# Patient Record
Sex: Female | Born: 1985 | Race: Black or African American | Hispanic: No | Marital: Married | Smoking: Never smoker
Health system: Southern US, Community
[De-identification: ages and names within clinical notes are randomized; demographics above are authoritative.]

---

## 2018-06-06 ENCOUNTER — Encounter (HOSPITAL_COMMUNITY): Payer: Self-pay | Admitting: Emergency Medicine

## 2018-06-06 ENCOUNTER — Other Ambulatory Visit: Payer: Self-pay

## 2018-06-06 ENCOUNTER — Emergency Department (HOSPITAL_COMMUNITY): Payer: Self-pay

## 2018-06-06 ENCOUNTER — Emergency Department (HOSPITAL_COMMUNITY)
Admission: EM | Admit: 2018-06-06 | Discharge: 2018-06-06 | Disposition: A | Discharge status: Home / Self Care | Payer: Self-pay | Attending: Emergency Medicine | Admitting: Emergency Medicine

## 2018-06-06 DIAGNOSIS — R946 Abnormal results of thyroid function studies: Secondary | ICD-10-CM | POA: Insufficient documentation

## 2018-06-06 DIAGNOSIS — R7989 Other specified abnormal findings of blood chemistry: Secondary | ICD-10-CM

## 2018-06-06 DIAGNOSIS — R1084 Generalized abdominal pain: Secondary | ICD-10-CM | POA: Insufficient documentation

## 2018-06-06 LAB — CBC
HCT: 38.5 % (ref 36.0–46.0)
HEMOGLOBIN: 12.1 g/dL (ref 12.0–15.0)
MCH: 27.9 pg (ref 26.0–34.0)
MCHC: 31.4 g/dL (ref 30.0–36.0)
MCV: 88.9 fL (ref 78.0–100.0)
Platelets: 290 10*3/uL (ref 150–400)
RBC: 4.33 MIL/uL (ref 3.87–5.11)
RDW: 12.4 % (ref 11.5–15.5)
WBC: 5.3 10*3/uL (ref 4.0–10.5)

## 2018-06-06 LAB — BASIC METABOLIC PANEL
ANION GAP: 7 (ref 5–15)
BUN: 6 mg/dL (ref 6–20)
CALCIUM: 8.8 mg/dL — AB (ref 8.9–10.3)
CO2: 21 mmol/L — ABNORMAL LOW (ref 22–32)
Chloride: 107 mmol/L (ref 98–111)
Creatinine, Ser: 0.54 mg/dL (ref 0.44–1.00)
GFR calc non Af Amer: >60 mL/min (ref >60)
GLUCOSE: 202 mg/dL — AB (ref 70–99)
Potassium: 4 mmol/L (ref 3.5–5.1)
SODIUM: 135 mmol/L (ref 135–145)

## 2018-06-06 LAB — I-STAT TROPONIN, ED: TROPONIN I, POC: 0 ng/mL (ref 0.00–0.08)

## 2018-06-06 LAB — I-STAT BETA HCG BLOOD, ED (MC, WL, AP ONLY): I-stat hCG, quantitative: <5 m[IU]/mL (ref <5)

## 2018-06-06 LAB — TSH

## 2018-06-06 MED ORDER — GI COCKTAIL ~~LOC~~
30.0000 mL | Freq: Once | ORAL | Status: AC
  Administered 2018-06-06: 30 mL via ORAL
  Filled 2018-06-06: qty 30

## 2018-06-06 MED ORDER — FAMOTIDINE 20 MG PO TABS
40.0000 mg | ORAL_TABLET | Freq: Once | ORAL | Status: AC
Start: 2018-06-06 — End: 2018-06-06
  Administered 2018-06-06: 40 mg via ORAL
  Filled 2018-06-06: qty 2

## 2018-06-06 NOTE — ED Triage Notes (Signed)
Translator/pt stated, her symptoms are digestion, heart burn, SOB when she walks. This all started 2 months. Ive been taken medicine and not helping.

## 2018-06-06 NOTE — Discharge Instructions (Addendum)
Merci de m'avoir permis de prodigodre vos soins aujourd'hui  l'urgence.  Maalox ou Pepto-bismal sont disponibles en vente libre et aident  rsoudre les problmes digestifs et les [employer]. Vous n'avez pas besoin d'une ordonnance pour [employer] mdicaments.   Vous pouvez [employer] forward[employer]lcharger une application sur votre appel tlphonique MyChart pour afficher vos laboratoires  partir d'aujourd'hui. Ils seront disponibles pour vous de voir dans les prochains jours.  J'ai inclus le nom de l'endocrinologie pratiques numres ci-dessus.  Vous pouvez appeler leurs bureaux pour voir si vous pouvez obtenir un rendez-vous de suivi dans les prochaines semaines.  Retournez au service des urgences si vous dveloppez une agitation grave, des Manufacturing systems engineermouvements anormaux du corps, des [employer] Kiewit Sonschangements dans votre vision, une frquence cardiaque trs rapide, ou d'autres nouveaux symptmes.  Thank you for allowing me to provide your care today in the emergency department.  Maalox or Pepto-bismal are available over the counter and help with digestive problems and upset stomach. You do not need a prescription for these medications.   You can download an App to your phone call MyChart to view your labs from today. They will be available for you to view in the next few days.  I have included the name of to endocrinology practices listed above.  You can call their offices to see if you can get in for a follow-up appointment in the next few weeks.  Return to the Emergency Department if you develop severe agitation, abnormal body movements, changes in your vision, a very fast heart rate, or other new, concerning symptoms.

## 2018-06-06 NOTE — ED Provider Notes (Signed)
Patient placed in Quick Look pathway, seen and evaluated   Chief Complaint: Heart racing,  Pain left chest  HPI:   Pt reports she has hyperthyroid.  Pt takes propranolol   ROS: heart racing  Physical Exam:   Gen: No distress  Neuro: Awake and Alert  Skin: Warm    Focused Exam: Heart tachycardia    Initiation of care has begun. The patient has been counseled on the process, plan, and necessity for staying for the completion/evaluation, and the remainder of the medical screening examination   Osie CheeksSofia, Chrisha Vogel K, PA-C 06/06/18 1353    Shaune PollackIsaacs, Cameron, MD 06/06/18 2024

## 2018-06-06 NOTE — ED Provider Notes (Signed)
[facility] EMERGENCY DEPARTMENT Provider Note   CSN: [id number] Arrival date & time: 06/06/18  1327     History   Chief Complaint Chief Complaint  Patient presents with  . Gastroesophageal Reflux  . Chest Pain    HPI Jessica Bray is a 32 y.o. female with a history of thyroid disease who presents to the emergency department with a chief complaint of "digestion problems".   The patient reports that she was seen and started on propanolol and levothyroxine several weeks ago after she was found to have tachycardia.  Her levothyroxine has since been increased from 40 to 60 mg.  She reports that she was having some "poking" nonradiating, substernal chest pain that would last for approximately 5 seconds, but reports that this pain in the recurrence has significantly improved since she was started on medication.  She has a follow-up appointment with endocrinology in 2 weeks when they return to [place].  The patient's husband states that he is concerned that she may be having other symptoms associated with her thyroid.  She has been endorsing an upset stomach and digestive problems as well as constipation.  Last bowel movement was this morning.    She denies fever, chills, dyspnea, vomiting, melena, hematochezia, back pain, dysuria, hematuria, or rash.   She last took acetaminophen 2 days ago when she was having diffuse muscle pain with resolution of her symptoms.  She states that she has been having muscle pain that seems to be worse at night, but she currently denies any active pain.  The history is provided by the patient. A language interpreter was used [place]).    History reviewed. No pertinent past medical history.  There are no active problems to display for this patient.   History reviewed. No pertinent surgical history.   OB History   None      Home Medications    Prior to Admission medications   Not on File    Family History No family history  on file.  Social History Social History   Tobacco Use  . Smoking status: Never Smoker  . Smokeless tobacco: Never Used  Substance Use Topics  . Alcohol use: Not Currently  . Drug use: Not Currently     Allergies   Patient has no known allergies.   Review of Systems Review of Systems  Constitutional: Negative for activity change, chills and fever.  HENT: Negative for congestion.   Eyes: Negative for visual disturbance.  Respiratory: Negative for cough, choking and shortness of breath.   Cardiovascular: Negative for chest pain, palpitations and leg swelling.  Gastrointestinal: Positive for abdominal pain and nausea. Negative for blood in stool, diarrhea and vomiting.  Genitourinary: Negative for dysuria, hematuria and urgency.  Musculoskeletal: Negative for back pain.  Skin: Negative for rash.  Allergic/Immunologic: Negative for immunocompromised state.  Neurological: Negative for dizziness, seizures, syncope, weakness, numbness and headaches.  Psychiatric/Behavioral: Negative for confusion.   Physical Exam Updated Vital Signs BP 114/75 (BP Location: Right Arm)   Pulse 87   Temp 98.5 F (36.9 C) (Oral)   Resp 16   Wt 40.8 kg (90 lb)   LMP 05/10/2018   SpO2 98%   Physical Exam  Constitutional: No distress.  HENT:  Head: Normocephalic.  Eyes: Conjunctivae are normal.  Neck: Normal range of motion. Neck supple. No tracheal deviation present. No thyromegaly present.  Cardiovascular: Normal rate, regular rhythm, normal heart sounds and intact distal pulses. Exam reveals no gallop and no friction rub.  No murmur heard. Pulmonary/Chest: Effort normal. No stridor. No respiratory distress. She has no wheezes. She has no rales. She exhibits no tenderness.  Abdominal: Soft. She exhibits no distension and no mass. There is no tenderness. There is no rebound and no guarding. No hernia.  Hyperactive bowel sounds.  Abdomen is soft, nontender, nondistended.  She has some right CVA  tenderness, left is nontender.  No guarding or rebound.  No peritoneal signs.  Negative Murphy sign.  No tenderness over McBurney's point.  Musculoskeletal: Normal range of motion. She exhibits no edema, tenderness or deformity.  Lymphadenopathy:    She has no cervical adenopathy.  Neurological: She is alert.  Skin: Skin is warm. Capillary refill takes less than 2 seconds. No rash noted.  Psychiatric: Her behavior is normal.  Nursing note and vitals reviewed.  ED Treatments / Results  Labs (all labs ordered are listed, but only abnormal results are displayed) Labs Reviewed  BASIC METABOLIC PANEL - Abnormal; Notable for the following components:      Result Value   CO2 21 (*)    Glucose, Bld 202 (*)    Calcium 8.8 (*)    All other components within normal limits  TSH - Abnormal; Notable for the following components:   TSH <0.010 (*)    All other components within normal limits  CBC  I-STAT TROPONIN, ED  I-STAT BETA HCG BLOOD, ED ([facility], [facility], [facility] ONLY)    EKG EKG Interpretation  Date/Time:  Monday June 06 2018 13:46:12 EDT Ventricular Rate:  108 PR Interval:  196 QRS Duration: 74 QT Interval:  334 QTC Calculation: 447 R Axis:   51 Text Interpretation:  Sinus tachycardia Nonspecific ST abnormality Abnormal ECG Confirmed by Kristine RoyalMessick, Peter [phone]([id number]) on 06/06/2018 6:21:41 PM   Radiology Dg Chest 2 View  Result Date: 06/06/2018 CLINICAL DATA:  Two-month history of shortness of breath while walking and indigestion. EXAM: CHEST - 2 VIEW COMPARISON:  None. FINDINGS: Suboptimal inspiration accounts for crowded bronchovascular markings, especially in the bases, and accentuates the cardiac silhouette. Taking this into account, cardiomediastinal silhouette unremarkable. Lungs clear. Bronchovascular markings normal. Pulmonary vascularity normal. No visible pleural effusions. No pneumothorax. Visualized bony thorax intact. IMPRESSION: Suboptimal inspiration.  No acute cardiopulmonary disease.  Electronically Signed   By: Hulan Saashomas  Lawrence M.D.   On: 06/06/2018 14:26    Procedures Procedures (including critical care time)  Medications Ordered in ED Medications  gi cocktail (Maalox,Lidocaine,Donnatal) (30 mLs Oral Given 06/06/18 1804)  famotidine (PEPCID) tablet 40 mg (40 mg Oral Given 06/06/18 1804)     Initial Impression / Assessment and Plan / ED Course  I have reviewed the triage vital signs and the nursing notes.  Pertinent labs & imaging results that were available during my care of the patient were reviewed by me and considered in my medical decision making (see chart for details).  Clinical Course as of Jun 07 1927  Mon Jun 06, 2018  1823 Patient recheck.  She is feeling much better after GI cocktail and reports that her upset stomach has resolved.   [MM]    Clinical Course User Index [MM] McDonald, Mia A, PA-C    32 year old female with history of thyroid disorder who [place] the [place] presenting with generalized abdominal discomfort.  The patient reported to triage staff that she had been having some chest pain for 2 months, but reports that this is actually improved since she was started on medication for her thyroid.  She denies any chest pain today.  Her TSH was found to be <0.010.  She has no signs or symptoms of neurologic manifestations, palpitations, increased perspiration, weight loss, or increased appetite or other symptoms associated with hyperthyroidism.  Labs today are otherwise reassuring.  On exam, she has hyperactive bowel sounds in all 4 quadrants, but no tenderness to palpation.  GI cocktail given.  She reports complete resolution of her abdominal discomfort.  Will provide the patient and her spouse with a referral to endocrinology or recommended they follow-up with endocrinology as soon as they return to [place].  She was given strict return precautions to the emergency department.  She is hemodynamically stable and in no acute  distress.  She is safe for discharge home at this time.  Final Clinical Impressions(s) / ED Diagnoses   Final diagnoses:  Low TSH level  Generalized abdominal pain    ED Discharge Orders    None       Barkley Boards, PA-C 06/06/18 1928    Wynetta Fines, MD 06/06/18 2315

## 2018-06-06 NOTE — ED Notes (Signed)
Patient verbalizes understanding of discharge instructions. Opportunity for questioning and answers were provided. Armband removed by staff, pt discharged from ED.  

## 2019-03-08 IMAGING — DX DG CHEST 2V
2 series · 2 of 2 positions shown · non-contrast
Comparison: None.

CLINICAL DATA: Two-month history of shortness of breath while
walking and indigestion.

EXAM:
CHEST - 2 VIEW

[w chest pa]
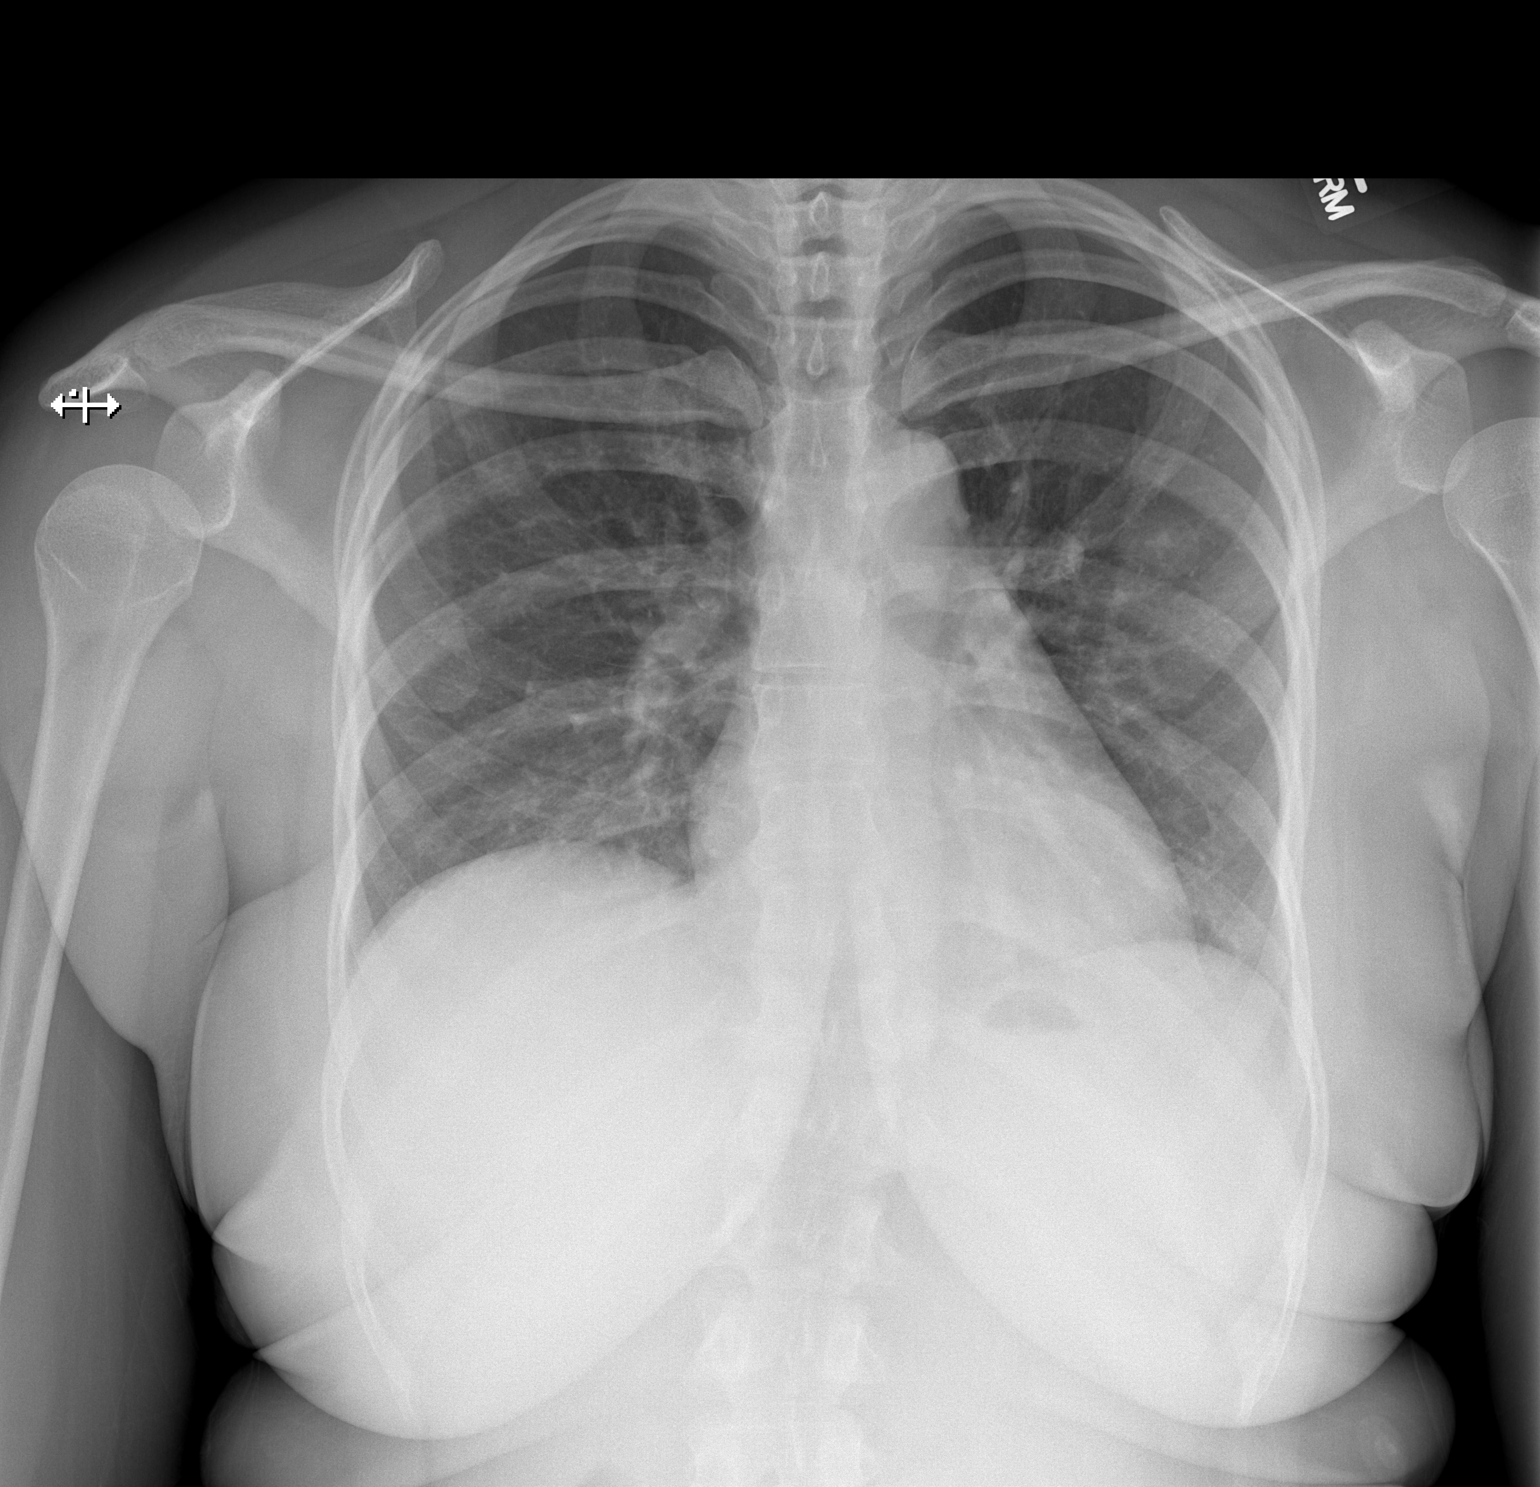

[w chest lat]
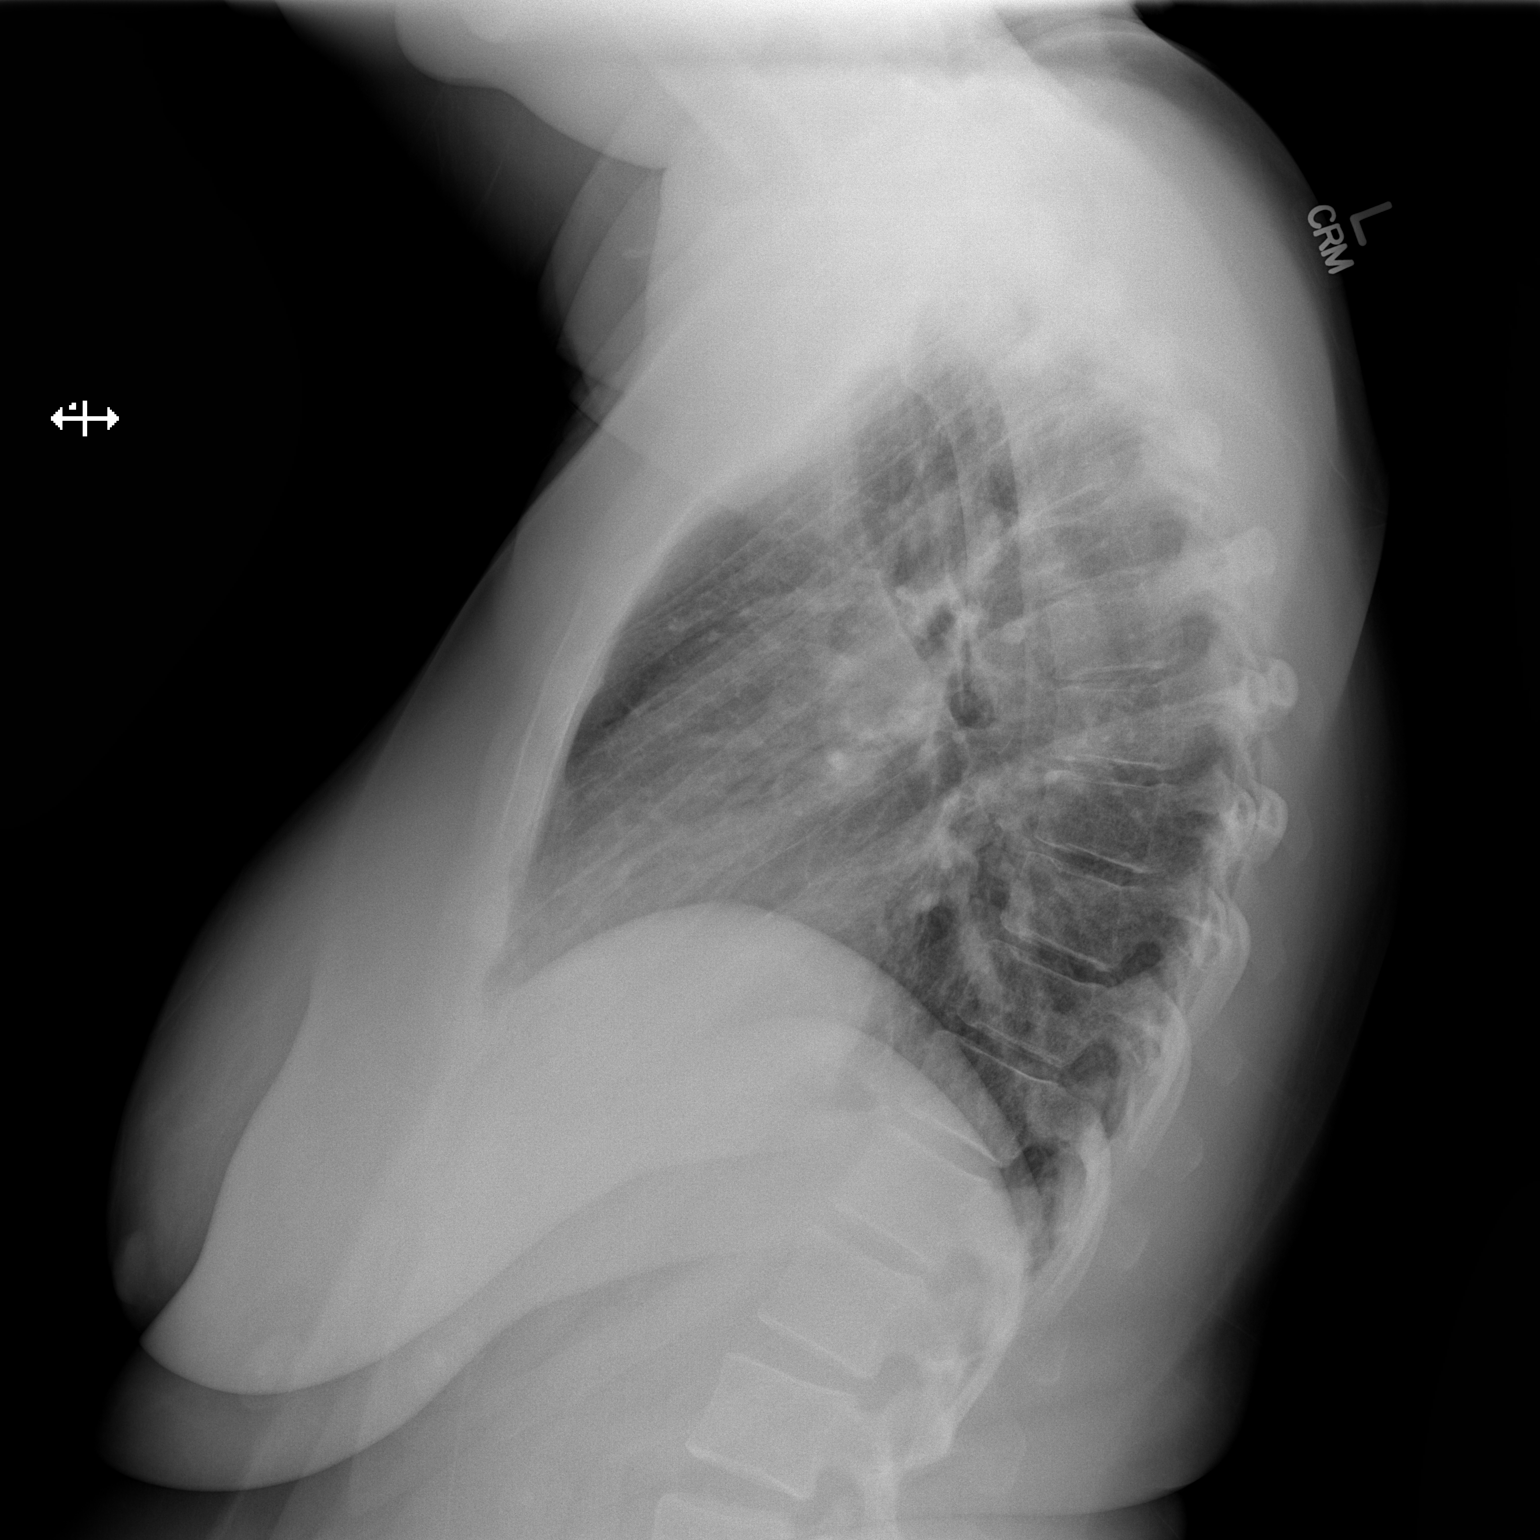

[2 of 2 positions shown; findings below may reference images not displayed]

FINDINGS: Suboptimal inspiration accounts for crowded bronchovascular
markings, especially in the bases, and accentuates the cardiac
silhouette. Taking this into account, cardiomediastinal silhouette
unremarkable. Lungs clear. Bronchovascular markings normal.
Pulmonary vascularity normal. No visible pleural effusions. No
pneumothorax. Visualized bony thorax intact.
IMPRESSION: Suboptimal inspiration.  No acute cardiopulmonary disease.
